# Patient Record
Sex: Female | Born: 2000 | Hispanic: Yes | Marital: Single | Smoking: Never smoker
Health system: Southern US, Community
[De-identification: ages and names within clinical notes are randomized; demographics above are authoritative.]

## PROBLEM LIST (undated history)

## (undated) DIAGNOSIS — Z789 Other specified health status: Secondary | ICD-10-CM

## (undated) DIAGNOSIS — N390 Urinary tract infection, site not specified: Secondary | ICD-10-CM

## (undated) HISTORY — PX: APPENDECTOMY: SHX54

## (undated) HISTORY — DX: Other specified health status: Z78.9

## (undated) HISTORY — DX: Urinary tract infection, site not specified: N39.0

---
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
## 2019-01-05 ENCOUNTER — Ambulatory Visit: Payer: Self-pay | Admitting: Obstetrics & Gynecology

## 2019-01-05 NOTE — Progress Notes (Deleted)
   Patient did not show up today for her scheduled appointment.   Heberto Sturdevant, MD, FACOG Obstetrician & Gynecologist, [facility],  Medical Group  

## 2020-11-17 NOTE — L&D Delivery Note (Addendum)
OB/GYN Faculty Practice Delivery Note  Anita Stokes is a 20 y.o. G2P1001 s/p VD at [redacted]w[redacted]d. She was admitted for eIOL.   ROM: 1h 58m with clear fluid GBS Status: Negative/-- (10/04 1405) Maximum Maternal Temperature: 98.6  Labor Progress: Initial SVE: 1.5/50/-3. S/p cytotec, FB, pitocin, and AROM. She then progressed to complete.   Delivery Date/Time: 08/31/21 @1409  Delivery: Patient was complete and pushing. Head delivered LOA. Loose nuchal cord present x1. Shoulder and body delivered in usual fashion. Infant with spontaneous cry, placed on mother's abdomen, dried and stimulated. Cord clamped x 2 after 1-minute delay, and cut by sister of patient. Cord blood drawn. Placenta delivered spontaneously with gentle cord traction. Fundus firm with massage and Pitocin. Labia, perineum, vagina, and cervix inspected with evidence of a 1st degree perineal laceration. Repaired with 3.0 vicryl suture with good hemostasis.   Baby Weight: pending  Placenta: 3 vessel, intact. Sent to L&D Complications: None Lacerations: 1st degree perineal EBL: 200 mL, TXA ppx Analgesia: Maternally supported and fentanyl intrapartum, fentanyl and lidocaine for laceration repair   Infant:  APGAR (1 MIN): 8   APGAR (5 MINS): 9    Anita Haycraft Autry-Lott, DO 08/31/2021, 2:48 PM PGY-3, [facility]    Midwife attestation: I was gloved and present for delivery in its entirety and I agree with the above resident's note.  09/02/2021, CNM 4:14 PM

## 2021-07-12 ENCOUNTER — Encounter: Payer: Self-pay | Admitting: *Deleted

## 2021-07-24 ENCOUNTER — Other Ambulatory Visit: Payer: Self-pay

## 2021-07-24 ENCOUNTER — Encounter: Payer: Self-pay | Admitting: Nurse Practitioner

## 2021-07-24 ENCOUNTER — Ambulatory Visit (INDEPENDENT_AMBULATORY_CARE_PROVIDER_SITE_OTHER): Payer: Medicaid Other | Admitting: Nurse Practitioner

## 2021-07-24 VITALS — BP 124/74 | HR 103 | Wt 251.0 lb

## 2021-07-24 DIAGNOSIS — Z8679 Personal history of other diseases of the circulatory system: Secondary | ICD-10-CM

## 2021-07-24 DIAGNOSIS — Z3A33 33 weeks gestation of pregnancy: Secondary | ICD-10-CM | POA: Diagnosis not present

## 2021-07-24 DIAGNOSIS — O0990 Supervision of high risk pregnancy, unspecified, unspecified trimester: Secondary | ICD-10-CM

## 2021-07-24 DIAGNOSIS — Z6836 Body mass index (BMI) 36.0-36.9, adult: Secondary | ICD-10-CM

## 2021-07-24 DIAGNOSIS — Z8759 Personal history of other complications of pregnancy, childbirth and the puerperium: Secondary | ICD-10-CM | POA: Insufficient documentation

## 2021-07-24 DIAGNOSIS — Z3490 Encounter for supervision of normal pregnancy, unspecified, unspecified trimester: Secondary | ICD-10-CM | POA: Insufficient documentation

## 2021-07-24 NOTE — Progress Notes (Signed)
Patient complains of daily contractions. She stated that each contraction last 30 seconds and 1 hour apart

## 2021-07-24 NOTE — Progress Notes (Signed)
Subjective:   Anita Stokes is a 20 y.o. G2P1001 at [redacted]w[redacted]d by LMP being seen today for her first obstetrical visit.  Her obstetrical history is significant for  gestational hypertension on medication for last birth, and obesity . Patient  would like to but was not successful breastfeeding last time - did not make much milk.  . Pregnancy history fully reviewed.  Patient reports occasional contractions.  HISTORY: OB History  Gravida Para Term Preterm AB Living  $Remov'2 1 1 'xDtrAM$ 0 0 1  SAB IAB Ectopic Multiple Live Births  0 0 0 0 1    # Outcome Date GA Lbr Len/2nd Weight Sex Delivery Anes PTL Lv  2 Current           1 Term 06/15/16 [redacted]w[redacted]d 01:07 / 00:22 8 lb 12 oz (3.969 kg) M Vag-Spont None  LIV     Name: Darwin,BOY Liberti     Apgar1: 8  Apgar5: 9   Past Medical History:  Diagnosis Date   Medical history non-contributory    Urinary tract infection 11/19/2015   Past Surgical History:  Procedure Laterality Date   APPENDECTOMY     2015   Family History  Problem Relation Age of Onset   Diabetes Mother    Diabetes Father    Social History   Tobacco Use   Smoking status: Never   Smokeless tobacco: Never  Substance Use Topics   Alcohol use: No   Drug use: No   No Known Allergies Current Outpatient Medications on File Prior to Visit  Medication Sig Dispense Refill   Prenatal Vit-Fe Fumarate-FA (MULTIVITAMIN-PRENATAL) 27-0.8 MG TABS tablet Take 1 tablet by mouth every evening.      acetaminophen (TYLENOL) 325 MG tablet Take 325 mg by mouth every 6 (six) hours as needed for mild pain, moderate pain or headache.     No current facility-administered medications on file prior to visit.     Exam   Vitals:   07/24/21 1402  BP: 124/74  Pulse: (!) 103  Weight: 251 lb (113.9 kg)   Fetal Heart Rate (bpm): 153  Uterus:     Pelvic Exam: Perineum: no hemorrhoids, normal perineum   Vulva: normal external genitalia, no lesions   Vagina:  normal mucosa, normal discharge   Cervix:  no lesions and normal, pap smear done.    Adnexa: normal adnexa and no mass, fullness, tenderness   Bony Pelvis: average  System: General: well-developed, well-nourished female in no acute distress   Breast:  normal appearance, no masses or tenderness   Skin: normal coloration and turgor, no rashes   Neurologic: oriented, normal, negative, normal mood   Extremities: normal strength, tone, and muscle mass, ROM of all joints is normal   HEENT extraocular movement intact and sclera clear, anicteric   Mouth/Teeth mucous membranes moist, pharynx normal without lesions and dental hygiene good   Neck supple and no masses, normal thyroid   Cardiovascular: regular rate and rhythm   Respiratory:  no respiratory distress, normal breath sounds   Abdomen: soft, non-tender; no masses,  no organomegaly     Assessment:   Pregnancy: G2P1001 Patient Active Problem List   Diagnosis Date Noted   Supervision of high risk pregnancy, antepartum 07/24/2021   History of chronic hypertension 07/24/2021   Language barrier 05/29/2016     Plan:  1. Supervision of high risk pregnancy, antepartum Ultrasound scheduled Normal one hour glucola in [place] - will not retest but will get Hgb A1C today  In person interpreter present for the entire visit  - Culture, OB Urine - [place] MFM OB DETAIL +14 WK; Future - Hemoglobin A1c - CBC/D/Plt+RPR+Rh+ABO+RubIgG... - Protein / creatinine ratio, urine - Comp Met (CMET)  2. History of chronic hypertension Bp normal today At age 71 had chronic hypertension with induction at [redacted]w[redacted]d with uncomplicated vaginal delivery at [place] reviewed  3. BMI 36.0-36.9,adult   4. [redacted] weeks gestation of pregnancy   Initial labs drawn. Continue prenatal vitamins. Genetic Screening discussed, NIPS: declined. Ultrasound discussed; fetal anatomic survey:  scheduled . Problem list reviewed and updated. The nature of [facility] with  multiple MDs and other Advanced Practice Providers was explained to patient; also emphasized that residents, students are part of our team. Routine obstetric precautions reviewed. Return in about 16 days (around 08/09/2021) for in person for vaginal swabs ROB.  Total face-to-face time with patient: 40 minutes.  Over 50% of encounter was spent on counseling and coordination of care.     Earlie Server, FNP Family Nurse Practitioner, [facility] for [employer], [place] 07/24/2021 9:35 PM

## 2021-07-25 LAB — COMPREHENSIVE METABOLIC PANEL
ALT: 15 IU/L (ref 0–32)
AST: 14 IU/L (ref 0–40)
Albumin/Globulin Ratio: 1.3 (ref 1.2–2.2)
Albumin: 3.9 g/dL (ref 3.9–5.0)
Alkaline Phosphatase: 127 IU/L — ABNORMAL HIGH (ref 44–121)
BUN/Creatinine Ratio: 11 (ref 9–23)
BUN: 6 mg/dL (ref 6–20)
Bilirubin Total: <0.2 mg/dL (ref 0.0–1.2)
CO2: 19 mmol/L — ABNORMAL LOW (ref 20–29)
Calcium: 9.1 mg/dL (ref 8.7–10.2)
Chloride: 102 mmol/L (ref 96–106)
Creatinine, Ser: 0.56 mg/dL — ABNORMAL LOW (ref 0.57–1.00)
Globulin, Total: 3.1 g/dL (ref 1.5–4.5)
Glucose: 69 mg/dL (ref 65–99)
Potassium: 4.1 mmol/L (ref 3.5–5.2)
Sodium: 137 mmol/L (ref 134–144)
Total Protein: 7 g/dL (ref 6.0–8.5)
eGFR: 133 mL/min/{1.73_m2} (ref >59)

## 2021-07-25 LAB — CBC/D/PLT+RPR+RH+ABO+RUBIGG...
Antibody Screen: NEGATIVE
Basophils Absolute: 0 10*3/uL (ref 0.0–0.2)
Basos: 0 %
EOS (ABSOLUTE): 0.1 10*3/uL (ref 0.0–0.4)
Eos: 1 %
HCV Ab: <0.1 s/co ratio (ref 0.0–0.9)
HIV Screen 4th Generation wRfx: NONREACTIVE
Hematocrit: 38.8 % (ref 34.0–46.6)
Hemoglobin: 13.3 g/dL (ref 11.1–15.9)
Hepatitis B Surface Ag: NEGATIVE
Immature Grans (Abs): 0.1 10*3/uL (ref 0.0–0.1)
Immature Granulocytes: 1 %
Lymphocytes Absolute: 2.4 10*3/uL (ref 0.7–3.1)
Lymphs: 18 %
MCH: 29.4 pg (ref 26.6–33.0)
MCHC: 34.3 g/dL (ref 31.5–35.7)
MCV: 86 fL (ref 79–97)
Monocytes Absolute: 0.9 10*3/uL (ref 0.1–0.9)
Monocytes: 7 %
Neutrophils Absolute: 9.7 10*3/uL — ABNORMAL HIGH (ref 1.4–7.0)
Neutrophils: 73 %
Platelets: 223 10*3/uL (ref 150–450)
RBC: 4.52 x10E6/uL (ref 3.77–5.28)
RDW: 12.7 % (ref 11.7–15.4)
RPR Ser Ql: NONREACTIVE
Rh Factor: POSITIVE
Rubella Antibodies, IGG: 6.23 index (ref >0.99)
WBC: 13.1 10*3/uL — ABNORMAL HIGH (ref 3.4–10.8)

## 2021-07-25 LAB — HCV INTERPRETATION

## 2021-07-25 LAB — PROTEIN / CREATININE RATIO, URINE
Creatinine, Urine: 24.9 mg/dL
Protein, Ur: <4 mg/dL

## 2021-07-25 LAB — HEMOGLOBIN A1C
Est. average glucose Bld gHb Est-mCnc: 105 mg/dL
Hgb A1c MFr Bld: 5.3 % (ref 4.8–5.6)

## 2021-07-26 LAB — CULTURE, OB URINE

## 2021-07-26 LAB — URINE CULTURE, OB REFLEX

## 2021-08-06 ENCOUNTER — Ambulatory Visit: Payer: Medicaid Other | Attending: Nurse Practitioner

## 2021-08-06 ENCOUNTER — Ambulatory Visit: Payer: Medicaid Other | Admitting: *Deleted

## 2021-08-06 ENCOUNTER — Encounter: Payer: Self-pay | Admitting: *Deleted

## 2021-08-06 ENCOUNTER — Other Ambulatory Visit: Payer: Self-pay

## 2021-08-06 VITALS — BP 123/63 | HR 105

## 2021-08-06 DIAGNOSIS — O0990 Supervision of high risk pregnancy, unspecified, unspecified trimester: Secondary | ICD-10-CM

## 2021-08-08 ENCOUNTER — Other Ambulatory Visit (HOSPITAL_COMMUNITY)
Admission: RE | Admit: 2021-08-08 | Discharge: 2021-08-08 | Disposition: A | Discharge status: Home / Self Care | Payer: Medicaid Other | Source: Ambulatory Visit | Attending: Obstetrics and Gynecology | Admitting: Obstetrics and Gynecology

## 2021-08-08 ENCOUNTER — Ambulatory Visit (INDEPENDENT_AMBULATORY_CARE_PROVIDER_SITE_OTHER): Payer: Medicaid Other | Admitting: Obstetrics and Gynecology

## 2021-08-08 ENCOUNTER — Other Ambulatory Visit: Payer: Self-pay

## 2021-08-08 DIAGNOSIS — O0990 Supervision of high risk pregnancy, unspecified, unspecified trimester: Secondary | ICD-10-CM

## 2021-08-08 DIAGNOSIS — Z23 Encounter for immunization: Secondary | ICD-10-CM | POA: Diagnosis not present

## 2021-08-08 DIAGNOSIS — Z8759 Personal history of other complications of pregnancy, childbirth and the puerperium: Secondary | ICD-10-CM

## 2021-08-08 LAB — OB RESULTS CONSOLE GC/CHLAMYDIA: Gonorrhea: NEGATIVE

## 2021-08-08 NOTE — Progress Notes (Addendum)
   PRENATAL VISIT NOTE  Subjective:  Anita Stokes is a 20 y.o. G2P1001 at [redacted]w[redacted]d being seen today for ongoing prenatal care.  She is currently monitored for the following issues for this low-risk pregnancy and has Language barrier; Supervision of high risk pregnancy, antepartum; and History of gestational hypertension on their problem list.  Patient reports  vaginal discharge .  Contractions: Irritability. Vag. Bleeding: None.  Movement: Present. Denies leaking of fluid.   The following portions of the patient's history were reviewed and updated as appropriate: allergies, current medications, past family history, past medical history, past social history, past surgical history and problem list.   Objective:   Vitals:   08/08/21 1431  BP: 119/66  Pulse: 98  Weight: 256 lb 11.2 oz (116.4 kg)    Fetal Status: Fetal Heart Rate (bpm): 155 Fundal Height: 37 cm Movement: Present  Presentation: Vertex  General:  Alert, oriented and cooperative. Patient is in no acute distress.  Skin: Skin is warm and dry. No rash noted.   Cardiovascular: Normal heart rate noted  Respiratory: Normal respiratory effort, no problems with respiration noted  Abdomen: Soft, gravid, appropriate for gestational age.  Pain/Pressure: Present     Pelvic: Cervical exam performed in the presence of a chaperone Dilation: 1.5 Effacement (%): 50 Station: -3  Extremities: Normal range of motion.  Edema: Trace  Mental Status: Normal mood and affect. Normal behavior. Normal judgment and thought content.   Assessment and Plan:  Pregnancy: G2P1001 at [redacted]w[redacted]d 1. Supervision of high risk pregnancy, antepartum GBS nv. Talk more about birth control nv. Anatomy u/s 87%, 3107gm, ac 99%, afi 18.2  - Cervicovaginal ancillary only( [place])  2. History of gestational hypertension I talked to her and asked her if she had ever been dx with htn outside of pregnancy and she said no. I asked her if she ever needed to take any  medications out of pregnancy for her BP and she said no. Based on this, I took away the dx of cHTN and just a h/o gHTN in her last pregnancy.   Preterm labor symptoms and general obstetric precautions including but not limited to vaginal bleeding, contractions, leaking of fluid and fetal movement were reviewed in detail with the patient. Please refer to After Visit Summary for other counseling recommendations.   Return in about 1 week (around 08/15/2021) for in person, low risk ob, md or app.  No future appointments.  [place] Bing, MD

## 2021-08-09 LAB — CERVICOVAGINAL ANCILLARY ONLY
Bacterial Vaginitis (gardnerella): NEGATIVE
Candida Glabrata: NEGATIVE
Candida Vaginitis: POSITIVE — AB
Chlamydia: NEGATIVE
Comment: NEGATIVE
Comment: NEGATIVE
Comment: NEGATIVE
Comment: NEGATIVE
Comment: NEGATIVE
Comment: NORMAL
Neisseria Gonorrhea: NEGATIVE
Trichomonas: NEGATIVE

## 2021-08-13 DIAGNOSIS — O0990 Supervision of high risk pregnancy, unspecified, unspecified trimester: Secondary | ICD-10-CM

## 2021-08-15 ENCOUNTER — Telehealth: Payer: Self-pay

## 2021-08-15 NOTE — Telephone Encounter (Signed)
Call placed to pt x 2. No answer and unable to leave message on VM. Called with [employer].  Pt has next ROB appt on 10/4 and will make aware of results at that appt if unable to reach.  Judeth Cornfield, RN

## 2021-08-15 NOTE — Telephone Encounter (Signed)
Pt returned call. Pt's sister on the phone with pt. Pt given results and recommendations pre Dr Vergie Living. Pt advised to pick up Monistat 7 from pharmacy for treatment. Pt verbalized understanding. Judeth Cornfield, RN

## 2021-08-15 NOTE — Telephone Encounter (Signed)
-----   Message from [place] Bing, MD sent at 08/14/2021  8:07 AM EDT ----- Can you let her know that she has a yeast infection and I've sent in monistat 7 for her? thanks

## 2021-08-20 ENCOUNTER — Other Ambulatory Visit: Payer: Self-pay

## 2021-08-20 ENCOUNTER — Ambulatory Visit (INDEPENDENT_AMBULATORY_CARE_PROVIDER_SITE_OTHER): Payer: Medicaid Other | Admitting: Advanced Practice Midwife

## 2021-08-20 VITALS — BP 105/67 | HR 99 | Wt 257.5 lb

## 2021-08-20 DIAGNOSIS — Z8759 Personal history of other complications of pregnancy, childbirth and the puerperium: Secondary | ICD-10-CM | POA: Diagnosis not present

## 2021-08-20 DIAGNOSIS — O3663X0 Maternal care for excessive fetal growth, third trimester, not applicable or unspecified: Secondary | ICD-10-CM

## 2021-08-20 DIAGNOSIS — Z789 Other specified health status: Secondary | ICD-10-CM | POA: Diagnosis not present

## 2021-08-20 DIAGNOSIS — Z3A37 37 weeks gestation of pregnancy: Secondary | ICD-10-CM

## 2021-08-20 DIAGNOSIS — Z3493 Encounter for supervision of normal pregnancy, unspecified, third trimester: Secondary | ICD-10-CM

## 2021-08-20 NOTE — Progress Notes (Signed)
   PRENATAL VISIT NOTE  Subjective:  Anita Stokes is a 20 y.o. G2P1001 at [redacted]w[redacted]d being seen today for ongoing prenatal care.  She is currently monitored for the following issues for this low-risk pregnancy and has Language barrier; Supervision of low-risk pregnancy; and History of gestational hypertension on their problem list.  Patient reports no complaints.  Contractions: Irritability. Vag. Bleeding: None.  Movement: Present. Denies leaking of fluid.   The following portions of the patient's history were reviewed and updated as appropriate: allergies, current medications, past family history, past medical history, past social history, past surgical history and problem list.   Objective:   Vitals:   08/20/21 1325  BP: 105/67  Pulse: 99  Weight: 257 lb 8 oz (116.8 kg)    Fetal Status: Fetal Heart Rate (bpm): 139   Movement: Present  Presentation: Vertex  General:  Alert, oriented and cooperative. Patient is in no acute distress.  Skin: Skin is warm and dry. No rash noted.   Cardiovascular: Normal heart rate noted  Respiratory: Normal respiratory effort, no problems with respiration noted  Abdomen: Soft, gravid, appropriate for gestational age.  Pain/Pressure: Present     Pelvic: Cervical exam performed in the presence of a chaperone Dilation: 1 Effacement (%): 50 Station: -3  Extremities: Normal range of motion.  Edema: None  Mental Status: Normal mood and affect. Normal behavior. Normal judgment and thought content.   Assessment and Plan:  Pregnancy: G2P1001 at [redacted]w[redacted]d 1. Language barrier affecting health care --Spanish interpreter used for all communication  2. [redacted] weeks gestation of pregnancy   3. History of gestational hypertension --BP grossly wnl today, no HTN this pregnancy  4. Encounter for supervision of low-risk pregnancy in third trimester --Anticipatory guidance about next visits/weeks of pregnancy given. --Next visit in 1 week   5. Excessive fetal growth  affecting management of pregnancy in third trimester, single or unspecified fetus --[place] on 08/06/21 with EFW 87%tile, FH today 39 cm at [redacted]w[redacted]d, c/w [place] estimate --Discussed risks to LGA with labor course, at delivery.  Will watch labor progress more closely.     Term labor symptoms and general obstetric precautions including but not limited to vaginal bleeding, contractions, leaking of fluid and fetal movement were reviewed in detail with the patient. Please refer to After Visit Summary for other counseling recommendations.   Return in about 1 week (around 08/27/2021).  Future Appointments  Date Time Provider Department Center  08/28/2021  1:35 PM Irene Bing, MD [facility]    Sharen Counter, CNM

## 2021-08-20 NOTE — Patient Instructions (Signed)
Razones para volver a [facility]:  1. Las contracciones son cada 5 minutos o menos, cada uno de los ltimos 1 minuto, stos han llevado a cabo durante 1-2 horas, y no se puede caminar o hablar durante ellas 2. Usted tiene un gran chorro de lquido o un goteo de lquido que no se detiene y hay que usar una toalla 3. Ha de sangrado que es de color rojo brillante, denso que el manchado - como sangrado menstrual (manchado puede ser normal en trabajo de parto prematuro o despus de una comprobacin de su cuello uterino) 4. Usted no se siente el beb se mueve como l / ella hace normalmente  

## 2021-08-20 NOTE — Progress Notes (Signed)
Patient reports pain in pelvis that started about 3 days ago

## 2021-08-24 LAB — CULTURE, BETA STREP (GROUP B ONLY): Strep Gp B Culture: NEGATIVE

## 2021-08-28 ENCOUNTER — Other Ambulatory Visit: Payer: Self-pay

## 2021-08-28 ENCOUNTER — Encounter: Payer: Self-pay | Admitting: Nurse Practitioner

## 2021-08-28 ENCOUNTER — Other Ambulatory Visit: Payer: Self-pay | Admitting: Advanced Practice Midwife

## 2021-08-28 ENCOUNTER — Ambulatory Visit (INDEPENDENT_AMBULATORY_CARE_PROVIDER_SITE_OTHER): Payer: Medicaid Other | Admitting: Nurse Practitioner

## 2021-08-28 VITALS — BP 132/72 | HR 107 | Wt 261.8 lb

## 2021-08-28 DIAGNOSIS — O3663X0 Maternal care for excessive fetal growth, third trimester, not applicable or unspecified: Secondary | ICD-10-CM | POA: Diagnosis not present

## 2021-08-28 DIAGNOSIS — O0990 Supervision of high risk pregnancy, unspecified, unspecified trimester: Secondary | ICD-10-CM

## 2021-08-28 DIAGNOSIS — O368130 Decreased fetal movements, third trimester, not applicable or unspecified: Secondary | ICD-10-CM | POA: Diagnosis not present

## 2021-08-28 DIAGNOSIS — Z789 Other specified health status: Secondary | ICD-10-CM | POA: Diagnosis not present

## 2021-08-28 DIAGNOSIS — Z3A38 38 weeks gestation of pregnancy: Secondary | ICD-10-CM

## 2021-08-28 NOTE — Progress Notes (Signed)
    Subjective:  Anita Stokes is a 20 y.o. G2P1001 at [redacted]w[redacted]d being seen today for ongoing prenatal care.  She is currently monitored for the following issues for this high-risk pregnancy and has Language barrier; Supervision of low-risk pregnancy; and History of gestational hypertension on their problem list.  Patient reports  decreased fetal movement, pressure over her heart and shortness of breath .  Contractions: Irritability. Vag. Bleeding: None.  Movement: (!) Decreased. Denies leaking of fluid.   The following portions of the patient's history were reviewed and updated as appropriate: allergies, current medications, past family history, past medical history, past social history, past surgical history and problem list. Problem list updated.  Objective:   Vitals:   08/28/21 1359  BP: 132/72  Pulse: (!) 107  Weight: 261 lb 12.8 oz (118.8 kg)    Fetal Status: Fetal Heart Rate (bpm): 163   Movement: (!) Decreased     General:  Alert, oriented and cooperative. Patient is in no acute distress.  Skin: Skin is warm and dry. No rash noted.   Cardiovascular: Normal heart rate noted  Respiratory: Normal respiratory effort, no problems with respiration noted  Abdomen: Soft, gravid, appropriate for gestational age. Pain/Pressure: Present     Pelvic:  Cervical exam deferred        Extremities: Normal range of motion.  Edema: Mild pitting, slight indentation  Mental Status: Normal mood and affect. Normal behavior. Normal judgment and thought content.   Urinalysis:      Assessment and Plan:  Pregnancy: G2P1001 at [redacted]w[redacted]d  1. Supervision of high risk pregnancy, antepartum Takes tums for heartburn History of chronic hypertension in last pregnancy - BP today higher than usual, will repeat labs to check for pre-eclampsia States she has pressure over her heart - has heartburn and BP is ok today so will recheck again on Friday.  Could be a combination of large fetal size and heartburn causing her  symptoms.  No diaphoresis or weakness, but client is worried.  Does have tachycardia but is normal  Consult with Dr. Crissie Reese - will schedule induction for Friday at 39 weeks  2. Excessive fetal growth affecting management of pregnancy in third trimester, single or unspecified fetus At last [place] baby was at 87%  3. Decreased fetal movements in third trimester, single or unspecified fetus Will get NST today  4. Language barrier Interpreter present for visit  5. [redacted] weeks gestation of pregnancy   Term labor symptoms and general obstetric precautions including but not limited to vaginal bleeding, contractions, leaking of fluid and fetal movement were reviewed in detail with the patient. Please refer to After Visit Summary for other counseling recommendations.  Return in about 2 days (around 08/30/2021) for in person ROB.  Nolene Bernheim, RN, MSN, NP-BC Nurse Practitioner, [facility] for [employer], [facility] 08/28/2021 2:26 PM

## 2021-08-29 LAB — COMPREHENSIVE METABOLIC PANEL
ALT: 13 IU/L (ref 0–32)
AST: 14 IU/L (ref 0–40)
Albumin/Globulin Ratio: 1.1 — ABNORMAL LOW (ref 1.2–2.2)
Albumin: 3.5 g/dL — ABNORMAL LOW (ref 3.9–5.0)
Alkaline Phosphatase: 163 IU/L — ABNORMAL HIGH (ref 44–121)
BUN/Creatinine Ratio: 11 (ref 9–23)
BUN: 7 mg/dL (ref 6–20)
Bilirubin Total: 0.2 mg/dL (ref 0.0–1.2)
CO2: 17 mmol/L — ABNORMAL LOW (ref 20–29)
Calcium: 8.9 mg/dL (ref 8.7–10.2)
Chloride: 104 mmol/L (ref 96–106)
Creatinine, Ser: 0.61 mg/dL (ref 0.57–1.00)
Globulin, Total: 3.2 g/dL (ref 1.5–4.5)
Glucose: 89 mg/dL (ref 70–99)
Potassium: 3.9 mmol/L (ref 3.5–5.2)
Sodium: 136 mmol/L (ref 134–144)
Total Protein: 6.7 g/dL (ref 6.0–8.5)
eGFR: 130 mL/min/{1.73_m2} (ref >59)

## 2021-08-29 LAB — CBC
Hematocrit: 37.6 % (ref 34.0–46.6)
Hemoglobin: 12.9 g/dL (ref 11.1–15.9)
MCH: 29.1 pg (ref 26.6–33.0)
MCHC: 34.3 g/dL (ref 31.5–35.7)
MCV: 85 fL (ref 79–97)
Platelets: 238 10*3/uL (ref 150–450)
RBC: 4.44 x10E6/uL (ref 3.77–5.28)
RDW: 12.6 % (ref 11.7–15.4)
WBC: 13.3 10*3/uL — ABNORMAL HIGH (ref 3.4–10.8)

## 2021-08-29 LAB — PROTEIN / CREATININE RATIO, URINE
Creatinine, Urine: 102.3 mg/dL
Protein, Ur: 10.7 mg/dL
Protein/Creat Ratio: 105 mg/g creat (ref 0–200)

## 2021-08-30 ENCOUNTER — Inpatient Hospital Stay (HOSPITAL_COMMUNITY)
Admission: AD | Admit: 2021-08-30 | Discharge: 2021-09-01 | DRG: 807 | Disposition: A | Discharge status: Home / Self Care | Payer: Medicaid Other | Attending: Family Medicine | Admitting: Family Medicine

## 2021-08-30 ENCOUNTER — Other Ambulatory Visit: Payer: Self-pay

## 2021-08-30 ENCOUNTER — Encounter (HOSPITAL_COMMUNITY): Payer: Self-pay | Admitting: Family Medicine

## 2021-08-30 ENCOUNTER — Inpatient Hospital Stay (HOSPITAL_COMMUNITY): Payer: Medicaid Other

## 2021-08-30 DIAGNOSIS — Z3493 Encounter for supervision of normal pregnancy, unspecified, third trimester: Secondary | ICD-10-CM

## 2021-08-30 DIAGNOSIS — Z8759 Personal history of other complications of pregnancy, childbirth and the puerperium: Secondary | ICD-10-CM

## 2021-08-30 DIAGNOSIS — Z20822 Contact with and (suspected) exposure to covid-19: Secondary | ICD-10-CM | POA: Diagnosis present

## 2021-08-30 DIAGNOSIS — Z3A39 39 weeks gestation of pregnancy: Secondary | ICD-10-CM | POA: Diagnosis not present

## 2021-08-30 DIAGNOSIS — O26893 Other specified pregnancy related conditions, third trimester: Secondary | ICD-10-CM | POA: Diagnosis present

## 2021-08-30 DIAGNOSIS — Z23 Encounter for immunization: Secondary | ICD-10-CM | POA: Diagnosis not present

## 2021-08-30 DIAGNOSIS — Z3490 Encounter for supervision of normal pregnancy, unspecified, unspecified trimester: Secondary | ICD-10-CM | POA: Diagnosis present

## 2021-08-30 LAB — CBC
HCT: 36.9 % (ref 36.0–46.0)
Hemoglobin: 12.4 g/dL (ref 12.0–15.0)
MCH: 29.2 pg (ref 26.0–34.0)
MCHC: 33.6 g/dL (ref 30.0–36.0)
MCV: 86.8 fL (ref 80.0–100.0)
Platelets: 222 10*3/uL (ref 150–400)
RBC: 4.25 MIL/uL (ref 3.87–5.11)
RDW: 13.3 % (ref 11.5–15.5)
WBC: 13.4 10*3/uL — ABNORMAL HIGH (ref 4.0–10.5)
nRBC: 0 % (ref 0.0–0.2)

## 2021-08-30 MED ORDER — LIDOCAINE HCL (PF) 1 % IJ SOLN
30.0000 mL | INTRAMUSCULAR | Status: AC | PRN
  Administered 2021-08-31: 30 mL via SUBCUTANEOUS
  Filled 2021-08-30: qty 30

## 2021-08-30 MED ORDER — OXYCODONE-ACETAMINOPHEN 5-325 MG PO TABS: 2.0000 | ORAL_TABLET | ORAL | Status: DC | PRN

## 2021-08-30 MED ORDER — LACTATED RINGERS IV SOLN: 500.0000 mL | INTRAVENOUS | Status: DC | PRN

## 2021-08-30 MED ORDER — OXYTOCIN-SODIUM CHLORIDE 30-0.9 UT/500ML-% IV SOLN
1.0000 m[IU]/min | INTRAVENOUS | Status: DC
Start: 2021-08-30 — End: 2021-08-31
  Filled 2021-08-30: qty 500

## 2021-08-30 MED ORDER — ACETAMINOPHEN 325 MG PO TABS: 650.0000 mg | ORAL_TABLET | ORAL | Status: DC | PRN

## 2021-08-30 MED ORDER — FLEET ENEMA 7-19 GM/118ML RE ENEM: 1.0000 | ENEMA | RECTAL | Status: DC | PRN

## 2021-08-30 MED ORDER — TERBUTALINE SULFATE 1 MG/ML IJ SOLN: 0.2500 mg | Freq: Once | INTRAMUSCULAR | Status: DC | PRN

## 2021-08-30 MED ORDER — ONDANSETRON HCL 4 MG/2ML IJ SOLN: 4.0000 mg | Freq: Four times a day (QID) | INTRAMUSCULAR | Status: DC | PRN

## 2021-08-30 MED ORDER — MISOPROSTOL 25 MCG QUARTER TABLET: 25.0000 ug | ORAL_TABLET | ORAL | Status: DC | PRN

## 2021-08-30 MED ORDER — OXYTOCIN BOLUS FROM INFUSION
333.0000 mL | Freq: Once | INTRAVENOUS | Status: AC
Start: 2021-08-30 — End: 2021-08-31
  Administered 2021-08-31: 333 mL via INTRAVENOUS

## 2021-08-30 MED ORDER — LACTATED RINGERS IV SOLN: INTRAVENOUS | Status: DC

## 2021-08-30 MED ORDER — SOD CITRATE-CITRIC ACID 500-334 MG/5ML PO SOLN: 30.0000 mL | ORAL | Status: DC | PRN

## 2021-08-30 MED ORDER — OXYTOCIN-SODIUM CHLORIDE 30-0.9 UT/500ML-% IV SOLN
2.5000 [IU]/h | INTRAVENOUS | Status: DC
  Administered 2021-08-31: 2.5 [IU]/h via INTRAVENOUS

## 2021-08-30 MED ORDER — OXYCODONE-ACETAMINOPHEN 5-325 MG PO TABS: 1.0000 | ORAL_TABLET | ORAL | Status: DC | PRN

## 2021-08-30 NOTE — H&P (Addendum)
OBSTETRIC ADMISSION HISTORY AND PHYSICAL  Anita Stokes is a 20 y.o. female G2P1001 with IUP at [redacted]w[redacted]d by LMP presenting for elective IOL. She reports +FMs, No LOF, no VB, no blurry vision, headaches or peripheral edema, and RUQ pain.  She plans on breast and bottle feeding. She declines birth control. She received her prenatal [facility]   Dating: By LMP --->  Estimated Date of Delivery: 09/06/21  Sono (08/06/21):  @[redacted]w[redacted]d , normal anatomy, cephalic presentation, 3107g, EFW  Spanish interpreter on iPad throughout entire encounter.   Prenatal History/Complications: language barrier (Spanish), cHTN in previous pregnancy, excessive fetal growth  Past Medical History: Past Medical History:  Diagnosis Date   Medical history non-contributory    Urinary tract infection 11/19/2015    Past Surgical History: Past Surgical History:  Procedure Laterality Date   APPENDECTOMY     2015    Obstetrical History: OB History     Gravida  2   Para  1   Term  1   Preterm      AB      Living  1      SAB      IAB      Ectopic      Multiple  0   Live Births  1           Social History Social History   Socioeconomic History   Marital status: Single    Spouse name: Not on file   Number of children: Not on file   Years of education: Not on file   Highest education level: Not on file  Occupational History   Not on file  Tobacco Use   Smoking status: Never   Smokeless tobacco: Never  Substance and Sexual Activity   Alcohol use: No   Drug use: No   Sexual activity: Yes  Other Topics Concern   Not on file  Social History Narrative   Not on file   Social Determinants of Health   Financial Resource Strain: Not on file  Food Insecurity: No Food Insecurity   Worried About Running Out of Food in the Last Year: Never true   Ran Out of Food in the Last Year: Never true  Transportation Needs: No Transportation Needs   Lack of Transportation (Medical): No   Lack of  Transportation (Non-Medical): No  Physical Activity: Not on file  Stress: Not on file  Social Connections: Not on file    Family History: Family History  Problem Relation Age of Onset   Diabetes Mother    Diabetes Father     Allergies: No Known Allergies  Medications Prior to Admission  Medication Sig Dispense Refill Last Dose   acetaminophen (TYLENOL) 325 MG tablet Take 325 mg by mouth every 6 (six) hours as needed for mild pain, moderate pain or headache.      Prenatal Vit-Fe Fumarate-FA (MULTIVITAMIN-PRENATAL) 27-0.8 MG TABS tablet Take 1 tablet by mouth every evening.         Review of Systems   All systems reviewed and negative except as stated in HPI  Last menstrual period 11/30/2020, unknown if currently breastfeeding. General appearance: alert and cooperative Lungs: Normal WOB  Heart: regular rate and rhythm Abdomen: soft, non-tender; bowel sounds normal Pelvic: 1.5/50/-3 Extremities: Homans sign is negative, no sign of DVT Presentation: cephalic Fetal monitoringBaseline: 140 bpm, Variability: Good {> 6 bpm), Accelerations: Reactive, and Decelerations: Absent Uterine activity: None   Prenatal labs: ABO, Rh: O/Positive/-- (09/07 1516) Antibody: Negative (  09/07 1516) Rubella: 6.23 (09/07 1516) RPR: Non Reactive (09/07 1516)  HBsAg: Negative (09/07 1516)  HIV: Non Reactive (09/07 1516)  GBS: Negative/-- (10/04 1405)  1 hr Glucola: none A1c (07/24/21): 5.3 Genetic screening: none Anatomy US: normal  Prenatal Transfer Tool  Maternal Diabetes: No Genetic Screening: Declined Maternal Ultrasounds/Referrals: Normal Fetal Ultrasounds or other Referrals:  None Maternal Substance Abuse:  No Significant Maternal Medications:  None Significant Maternal Lab Results: None  No results found for this or any previous visit (from the past 24 hour(s)).  Patient Active Problem List   Diagnosis Date Noted   Supervision of low-risk pregnancy 07/24/2021   History of  gestational hypertension 07/24/2021   Language barrier 05/29/2016    Assessment/Plan:  Anita Stokes is a 20 y.o. G2P1001 at [redacted]w[redacted]d here for elective IOL.   #Labor: After verbal consent, attempted to place FB manually, however unsuccessful due to significantly posterior. Speculum placement attempted as well without success. Cytotec buccal given. Consider re-trying FB on next check if still appropriate.  #Pain: None #Fetal Well Being: Category I #ID: GBS neg #Method Of Feeding: both #Method Of Contraception: no method, aware of her options.  #Circ: N/A  #Size greater than dates: Noted during prenatal care. EFW 87%tile at [redacted]w[redacted]d (3107gm). Pelvis proven to 3969g.   Shelby Mattocks, DO 08/30/2021, 10:37 PM PGY-1, [facility]  GME ATTESTATION:  I saw and evaluated the patient. I agree with the findings and the plan of care as documented in the resident's note.  Leticia Penna, DO OB Fellow, Faculty [facility], Center for [facility] 08/31/2021 2:12 AM

## 2021-08-31 ENCOUNTER — Encounter (HOSPITAL_COMMUNITY): Payer: Self-pay | Admitting: Family Medicine

## 2021-08-31 DIAGNOSIS — Z3A39 39 weeks gestation of pregnancy: Secondary | ICD-10-CM

## 2021-08-31 LAB — TYPE AND SCREEN
ABO/RH(D): O POS
Antibody Screen: NEGATIVE

## 2021-08-31 LAB — RESP PANEL BY RT-PCR (FLU A&B, COVID) ARPGX2
Influenza A by PCR: NEGATIVE
Influenza B by PCR: NEGATIVE
SARS Coronavirus 2 by RT PCR: NEGATIVE

## 2021-08-31 LAB — RPR: RPR Ser Ql: NONREACTIVE

## 2021-08-31 MED ORDER — INFLUENZA VAC SPLIT QUAD 0.5 ML IM SUSY
0.5000 mL | PREFILLED_SYRINGE | INTRAMUSCULAR | Status: AC
  Administered 2021-09-01: 0.5 mL via INTRAMUSCULAR
  Filled 2021-08-31: qty 0.5

## 2021-08-31 MED ORDER — MISOPROSTOL 50MCG HALF TABLET
ORAL_TABLET | ORAL | Status: AC
  Filled 2021-08-31: qty 1

## 2021-08-31 MED ORDER — FENTANYL CITRATE (PF) 100 MCG/2ML IJ SOLN
50.0000 ug | INTRAMUSCULAR | Status: DC | PRN
Start: 2021-08-31 — End: 2021-08-31
  Administered 2021-08-31: 100 ug via INTRAVENOUS
  Administered 2021-08-31: 50 ug via INTRAVENOUS
  Filled 2021-08-31 (×2): qty 2

## 2021-08-31 MED ORDER — COCONUT OIL OIL: 1.0000 "application " | TOPICAL_OIL | Status: DC | PRN

## 2021-08-31 MED ORDER — WITCH HAZEL-GLYCERIN EX PADS: 1.0000 "application " | MEDICATED_PAD | CUTANEOUS | Status: DC | PRN

## 2021-08-31 MED ORDER — SIMETHICONE 80 MG PO CHEW: 80.0000 mg | CHEWABLE_TABLET | ORAL | Status: DC | PRN

## 2021-08-31 MED ORDER — IBUPROFEN 600 MG PO TABS
600.0000 mg | ORAL_TABLET | Freq: Four times a day (QID) | ORAL | Status: DC
  Administered 2021-08-31 – 2021-09-01 (×5): 600 mg via ORAL
  Filled 2021-08-31 (×4): qty 1

## 2021-08-31 MED ORDER — MISOPROSTOL 50MCG HALF TABLET
50.0000 ug | ORAL_TABLET | ORAL | Status: DC | PRN
  Administered 2021-08-31: 50 ug via BUCCAL

## 2021-08-31 MED ORDER — OXYTOCIN-SODIUM CHLORIDE 30-0.9 UT/500ML-% IV SOLN: 1.0000 m[IU]/min | INTRAVENOUS | Status: DC

## 2021-08-31 MED ORDER — BENZOCAINE-MENTHOL 20-0.5 % EX AERO: 1.0000 "application " | INHALATION_SPRAY | CUTANEOUS | Status: DC | PRN

## 2021-08-31 MED ORDER — ONDANSETRON HCL 4 MG PO TABS: 4.0000 mg | ORAL_TABLET | ORAL | Status: DC | PRN

## 2021-08-31 MED ORDER — SENNOSIDES-DOCUSATE SODIUM 8.6-50 MG PO TABS
2.0000 | ORAL_TABLET | Freq: Every day | ORAL | Status: DC
  Administered 2021-09-01: 2 via ORAL
  Filled 2021-08-31: qty 2

## 2021-08-31 MED ORDER — ONDANSETRON HCL 4 MG/2ML IJ SOLN: 4.0000 mg | INTRAMUSCULAR | Status: DC | PRN

## 2021-08-31 MED ORDER — TRANEXAMIC ACID-NACL 1000-0.7 MG/100ML-% IV SOLN: 1000.0000 mg | INTRAVENOUS | Status: DC

## 2021-08-31 MED ORDER — DIPHENHYDRAMINE HCL 25 MG PO CAPS: 25.0000 mg | ORAL_CAPSULE | Freq: Four times a day (QID) | ORAL | Status: DC | PRN

## 2021-08-31 MED ORDER — ZOLPIDEM TARTRATE 5 MG PO TABS: 5.0000 mg | ORAL_TABLET | Freq: Every evening | ORAL | Status: DC | PRN

## 2021-08-31 MED ORDER — FENTANYL CITRATE (PF) 100 MCG/2ML IJ SOLN
INTRAMUSCULAR | Status: AC
  Administered 2021-08-31: 100 ug via INTRAVENOUS
  Filled 2021-08-31: qty 2

## 2021-08-31 MED ORDER — TRANEXAMIC ACID-NACL 1000-0.7 MG/100ML-% IV SOLN
INTRAVENOUS | Status: AC
  Administered 2021-08-31: 1000 mg
  Filled 2021-08-31: qty 100

## 2021-08-31 MED ORDER — TETANUS-DIPHTH-ACELL PERTUSSIS 5-2.5-18.5 LF-MCG/0.5 IM SUSY: 0.5000 mL | PREFILLED_SYRINGE | Freq: Once | INTRAMUSCULAR | Status: DC

## 2021-08-31 MED ORDER — OXYTOCIN-SODIUM CHLORIDE 30-0.9 UT/500ML-% IV SOLN
1.0000 m[IU]/min | INTRAVENOUS | Status: DC
  Administered 2021-08-31: 1 m[IU]/min via INTRAVENOUS

## 2021-08-31 MED ORDER — ACETAMINOPHEN 325 MG PO TABS
650.0000 mg | ORAL_TABLET | ORAL | Status: DC | PRN
  Administered 2021-08-31: 650 mg via ORAL
  Filled 2021-08-31: qty 2

## 2021-08-31 MED ORDER — PRENATAL MULTIVITAMIN CH
1.0000 | ORAL_TABLET | Freq: Every day | ORAL | Status: DC
  Administered 2021-09-01: 1 via ORAL
  Filled 2021-08-31: qty 1

## 2021-08-31 MED ORDER — DIBUCAINE (PERIANAL) 1 % EX OINT: 1.0000 "application " | TOPICAL_OINTMENT | CUTANEOUS | Status: DC | PRN

## 2021-08-31 NOTE — Progress Notes (Signed)
Labor Progress Note Anita Stokes is a 20 y.o. G2P1001 at [redacted]w[redacted]d presented for eIOL.   S: Doing well, feeling some cramping and contractions occasionally.   O:  BP 133/69   Pulse 84   Temp 98.3 F (36.8 C) (Oral)   Resp 15   Ht 5\' 6"  (1.676 m)   Wt 119 kg   LMP 11/30/2020 (Exact Date)   BMI 42.34 kg/m  EFM: 145/mod/15x15/none Ctx every 2-4 min   CVE: Dilation: 2 Effacement (%): 60-70 Station: -3 Presentation: Vertex Exam by:: Dr. [email or web address]   A&P: 20 y.o. G2P1001 [redacted]w[redacted]d  #Labor: Some progression since last check, relatively soft cervix at this point. After verbal consent, placed FB with some difficulty to left positioned cervix. Patient tolerated well. Will also start pit 1x1 up to approximately 6 while FB still in place.  #Pain: PRN.  #FWB: Cat 1  #GBS negative   [redacted]w[redacted]d, DO 4:16 AM

## 2021-08-31 NOTE — Discharge Summary (Signed)
Postpartum Discharge Summary     Patient Name: Anita Stokes DOB: [birth year] MRN: [id number]  Date of admission: 08/30/2021 Delivery date:08/31/2021  Delivering provider: Gerlene Fee  Date of discharge: 09/01/2021  Admitting diagnosis: Encounter for elective induction of labor [Z34.90] Intrauterine pregnancy: [redacted]w[redacted]d    Secondary diagnosis:  Active Problems:   Encounter for elective induction of labor   Vaginal delivery  Additional problems: None    Discharge diagnosis: Term Pregnancy Delivered                                              Post partum procedures: N/A Augmentation: AROM, Pitocin, Cytotec, and IP Foley Complications: None  Hospital course: Induction of Labor With Vaginal Delivery   20y.o. yo [id number] 3100w1das admitted to the hospital 08/30/2021 for induction of labor.  Indication for induction: Elective.  Patient had an uncomplicated labor course as follows: Membrane Rupture Time/Date: 12:14 PM ,08/31/2021   Delivery Method:Vaginal, Spontaneous  Episiotomy: None  Lacerations:  1st degree;Perineal  Details of delivery can be found in separate delivery note.  Patient had a routine postpartum course. Patient is discharged home 09/01/21.  Newborn Data: Birth date:08/31/2021  Birth time:2:09 PM  Gender:Female  Living status:Living  Apgars:8 ,9  Weight:3640 g   Magnesium Sulfate received: No BMZ received: No Rhophylac:No MMR:No T-DaP:Given prenatally Flu: No Transfusion:No  Physical exam  Vitals:   08/31/21 1758 08/31/21 2145 09/01/21 0530 09/01/21 1318  BP: 124/61 127/64 123/68 135/66  Pulse: 96 87 75 84  Resp: _0 Temp: 99.1 F (37.3 C) 98.6 F (37 C) 98.4 F (36.9 C) 98.1 F (36.7 C)  TempSrc: Oral Oral Oral Oral  SpO2:   98% 97%  Weight:      Height:       General: alert, cooperative, and no distress Lochia: appropriate Uterine Fundus: firm Incision: N/A DVT Evaluation: No evidence of DVT seen on physical  exam. Labs: Lab Results  Component Value Date   WBC 15.6 (H) 09/01/2021   HGB 10.8 (L) 09/01/2021   HCT 32.6 (L) 09/01/2021   MCV 88.3 09/01/2021   PLT 202 09/01/2021   CMP Latest Ref Rng & Units 08/28/2021  Glucose 70 - 99 mg/dL 89  BUN 6 - 20 mg/dL 7  Creatinine 0.57 - 1.00 mg/dL 0.61  Sodium 134 - 144 mmol/L 136  Potassium 3.5 - 5.2 mmol/L 3.9  Chloride 96 - 106 mmol/L 104  CO2 20 - 29 mmol/L 17(L)  Calcium 8.7 - 10.2 mg/dL 8.9  Total Protein 6.0 - 8.5 g/dL 6.7  Total Bilirubin 0.0 - 1.2 mg/dL 0.2  Alkaline Phos 44 - 121 IU/L 163(H)  AST 0 - 40 IU/L 14  ALT 0 - 32 IU/L 13   Edinburgh Score:   After visit meds:  Allergies as of 09/01/2021   No Known Allergies      Medication List     TAKE these medications    acetaminophen 325 MG tablet Commonly known as: TYLENOL Take 325 mg by mouth every 6 (six) hours as needed for mild pain, moderate pain or headache.   ibuprofen 600 MG tablet Commonly known as: ADVIL Take 1 tablet (600 mg total) by mouth every 6 (six) hours.   multivitamin-prenatal 27-0.8 MG Tabs tablet Take 1 tablet by mouth every evening.  Discharge home in stable condition Infant Feeding: Breast Infant Disposition:home with mother Discharge instruction: per After Visit Summary and Postpartum booklet. Activity: Advance as tolerated. Pelvic rest for 6 weeks.  Diet: routine diet Future Appointments: Future Appointments  Date Time Provider [place]  09/09/2021 10:20 AM [facility] NURSE [facility]  10/02/2021  1:15 PM Gabriel Carina, CNM [facility]   Follow up Visit:  Follow-up Information     [facility] Follow up.   Specialty: Obstetrics and Gynecology Why: As needed in emergencies Contact information: [address] [id number] [place] [phone]                Message sent to [facility] on 08/31/21:  Please schedule this patient for a In person  postpartum visit in 4 weeks with the following provider: Any provider. Additional Postpartum F/U:BP check 1 week  Low risk pregnancy complicated by:  labile BPs, isolated elevated BP Delivery mode:  Vaginal, Spontaneous  Anticipated Birth Control:   none   09/01/2021 Manya Silvas, CNM

## 2021-08-31 NOTE — Progress Notes (Signed)
FB fell out per RN. Rechecked cervix, still quite posterior but now about 5/70/-3 and stretchy. Will transition to pit 2x2, anticipate AROM on next check hopefully as cervix comes forward.  Allayne Stack, DO

## 2021-08-31 NOTE — Progress Notes (Signed)
Labor Progress Note Anita Stokes is a 20 y.o. G2P1001 at [redacted]w[redacted]d presented for eIOL.  S: Resting. No concerns at this time. Some discomfort with contractions.   O:  BP 131/64   Pulse 95   Temp 97.9 F (36.6 C) (Oral)   Resp 16   Ht 5\' 6"  (1.676 m)   Wt 119 kg   LMP 11/30/2020 (Exact Date)   BMI 42.34 kg/m  EFM: 130 bpm/15x15/no decels; every 2-3 mins  CVE: Dilation: 5 Effacement (%): 70 Cervical Position: Posterior Station: -3 Presentation: Vertex Exam by:: [email or web address] MD  A&P: 20 y.o. G2P1001 [redacted]w[redacted]d here for eIOL.  #Labor: Cervical exam unchanged. AROM scant bloody show. Patient stated she has been feeling trickling since this morning. Likely increased amount of fluid behind head of infant. Will continue to titrate up on pitocin and monitor for progression of labor.  #Pain: Maternally support #FWB: Cat I #GBS negative  Talene Glastetter Autry-Lott, DO 12:29 PM

## 2021-09-01 LAB — CBC
HCT: 32.6 % — ABNORMAL LOW (ref 36.0–46.0)
Hemoglobin: 10.8 g/dL — ABNORMAL LOW (ref 12.0–15.0)
MCH: 29.3 pg (ref 26.0–34.0)
MCHC: 33.1 g/dL (ref 30.0–36.0)
MCV: 88.3 fL (ref 80.0–100.0)
Platelets: 202 10*3/uL (ref 150–400)
RBC: 3.69 MIL/uL — ABNORMAL LOW (ref 3.87–5.11)
RDW: 13.5 % (ref 11.5–15.5)
WBC: 15.6 10*3/uL — ABNORMAL HIGH (ref 4.0–10.5)
nRBC: 0 % (ref 0.0–0.2)

## 2021-09-01 MED ORDER — IBUPROFEN 600 MG PO TABS: 600.0000 mg | ORAL_TABLET | Freq: Four times a day (QID) | ORAL | 0 refills | Status: DC

## 2021-09-01 MED ORDER — IBUPROFEN 600 MG PO TABS: 600.0000 mg | ORAL_TABLET | Freq: Four times a day (QID) | ORAL | 1 refills | Status: AC

## 2021-09-09 ENCOUNTER — Other Ambulatory Visit: Payer: Self-pay

## 2021-09-09 ENCOUNTER — Ambulatory Visit (INDEPENDENT_AMBULATORY_CARE_PROVIDER_SITE_OTHER): Payer: Medicaid Other

## 2021-09-09 VITALS — BP 116/65 | HR 88 | Wt 243.4 lb

## 2021-09-09 DIAGNOSIS — Z0130 Encounter for examination of blood pressure without abnormal findings: Secondary | ICD-10-CM

## 2021-09-09 NOTE — Progress Notes (Signed)
Blood Pressure Check Visit  Anita Stokes is here for blood pressure check following spontaneous vaginal delivery on 08/31/21. BP today is 116/65. History significant for gestational hypertension in prior pregnancy. Pt denies any elevated BP during this pregnancy or delivery. Patient denies any s/s of elevated BP. Reviewed with Donavan Foil, MD who states pt may follow up at [facility] visit on 10/02/21.  Patient denies any other concerns today. Interpreter Raquel present for encounter.  Marjo Bicker, RN 09/09/2021  11:09 AM

## 2021-09-09 NOTE — Progress Notes (Signed)
Patient was assessed and managed by nursing staff during this encounter. I have reviewed the chart and agree with the documentation and plan. I have also made any necessary editorial changes.  Tonica Brasington A Jenney Brester, MD 09/09/2021 5:27 PM   

## 2021-09-12 ENCOUNTER — Telehealth (HOSPITAL_COMMUNITY): Payer: Self-pay | Admitting: *Deleted

## 2021-09-12 NOTE — Telephone Encounter (Signed)
Hospital discharge follow-up call completed with [facility] [address], [place] #[id number]. Patient voiced no questions or concerns regarding her own health. EPDS not done today - patient scored a 0  on EPDS on 09/09/21.Patient voiced no questions or concerns regarding baby at this time. Patient reported infant sleeps in a crib on her back. RN reviewed ABCs of safe sleep - patient verbalized understanding. Deforest Hoyles, RN, 09/12/21, [phone].

## 2021-10-02 ENCOUNTER — Ambulatory Visit: Payer: Self-pay | Admitting: Certified Nurse Midwife

## 2021-10-21 ENCOUNTER — Ambulatory Visit: Payer: Medicaid Other | Admitting: Obstetrics & Gynecology

## 2021-10-24 ENCOUNTER — Ambulatory Visit: Payer: Medicaid Other | Admitting: Obstetrics & Gynecology

## 2022-04-05 IMAGING — US US MFM OB DETAIL+14 WK
1 series · 13 of 28 positions shown · non-contrast
Comparison: none

[Series 1: us mfm ob detail+14 wk · 58 acquisitions, 13 frames shown]
[im 3/58]
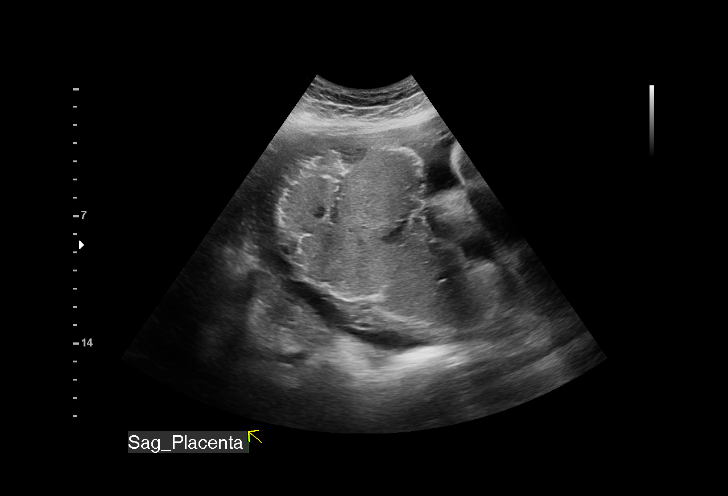
[im 7/58]
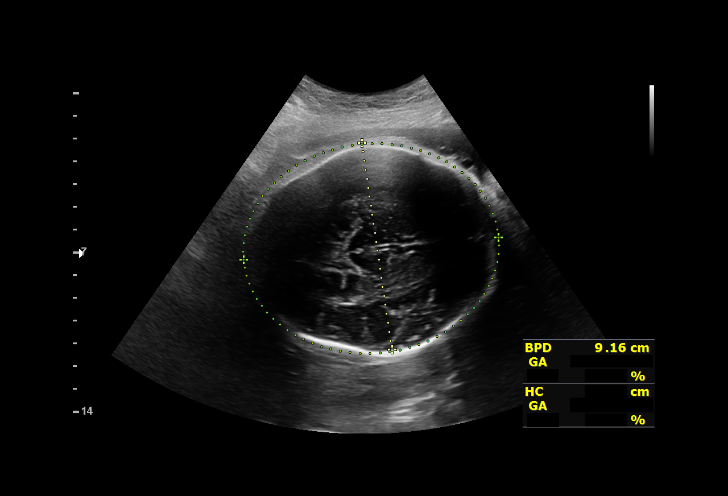
[im 11/58]
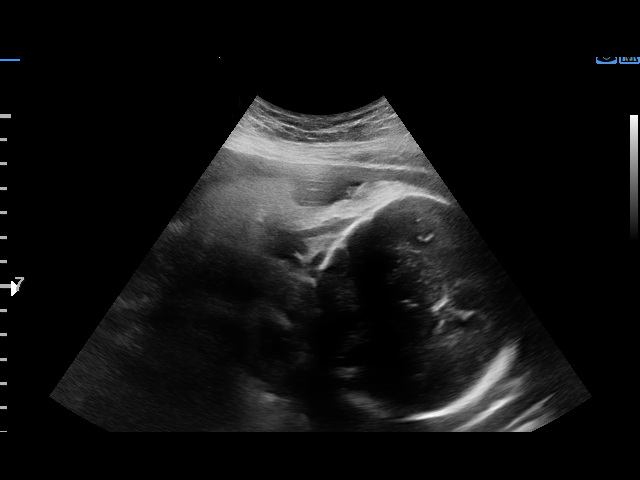
[im 15/58]
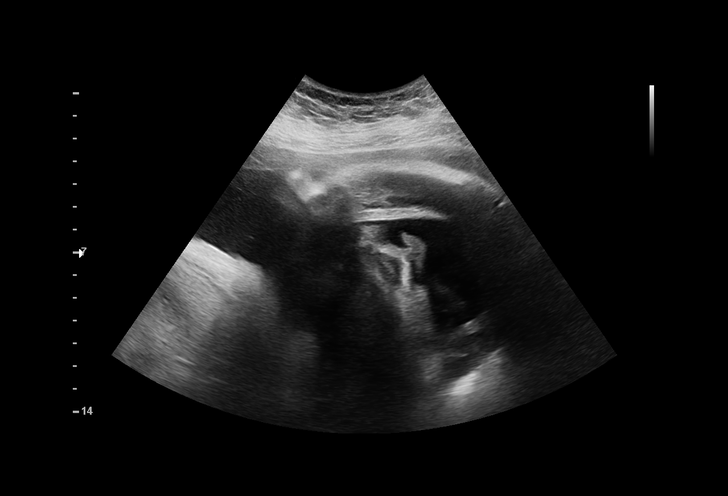
[im 20/58]
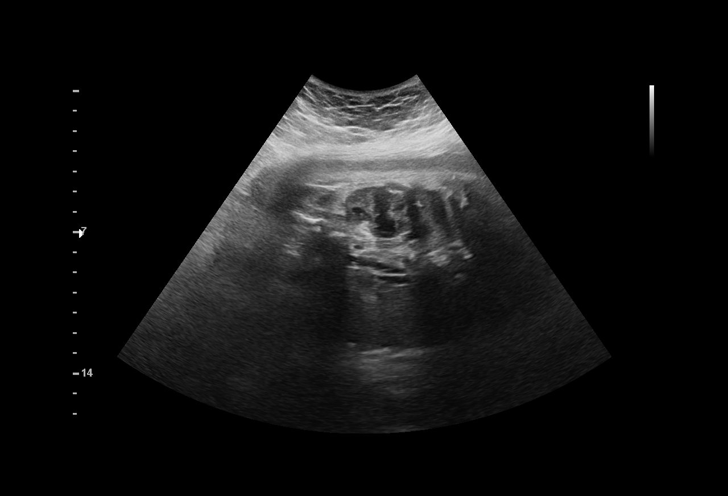
[im 24/58]
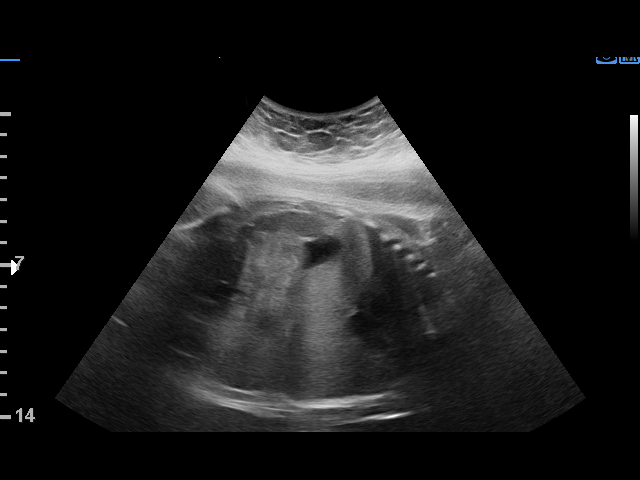
[im 30/58]
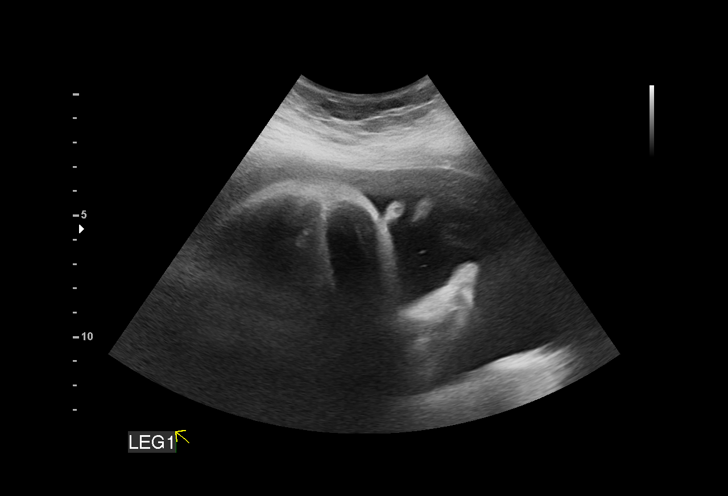
[im 34/58]
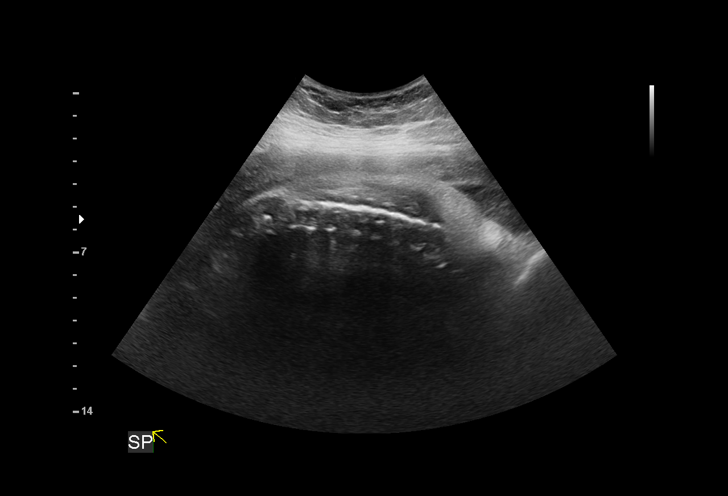
[im 39/58]
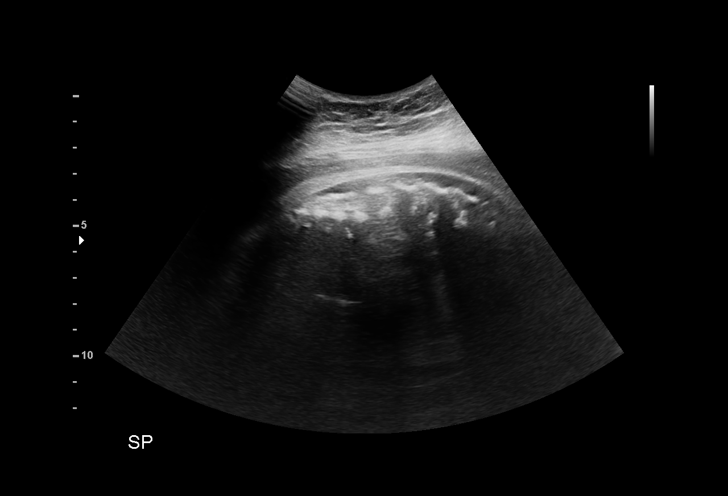
[im 43/58]
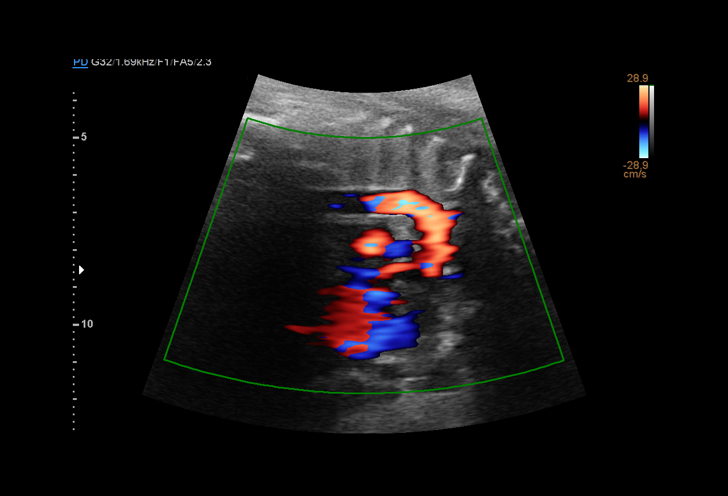
[im 47/58]
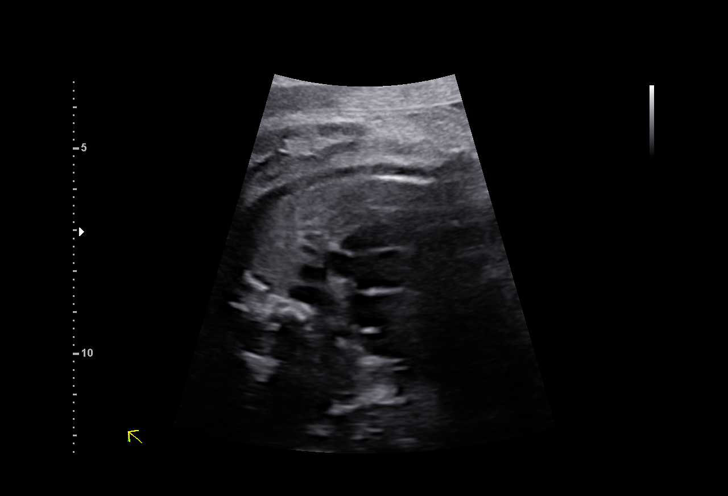
[im 51/58]
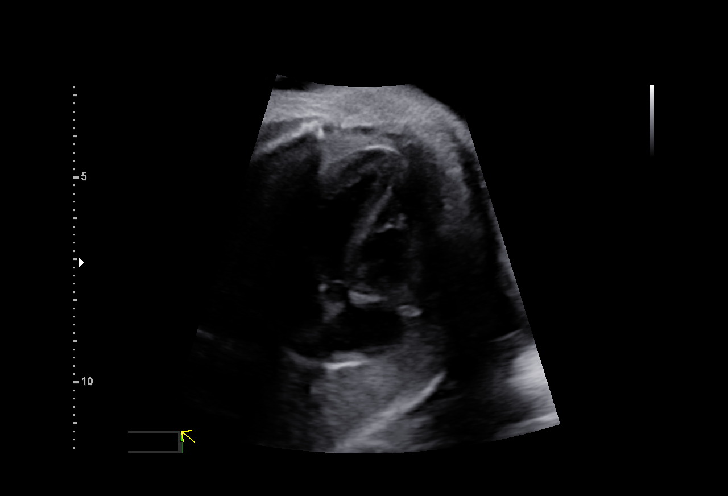
[im 55/58]
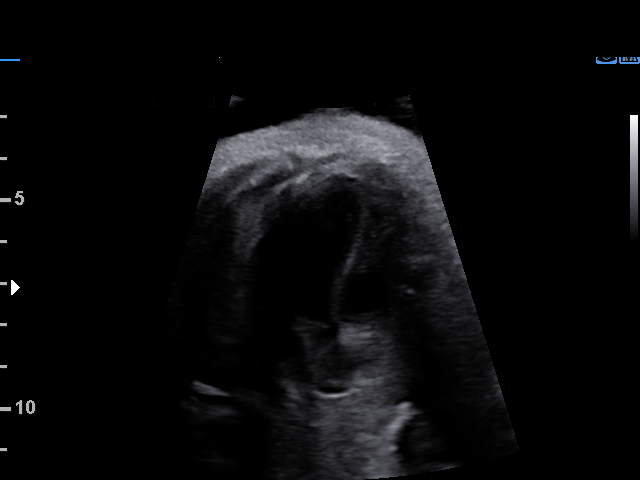

[13 of 28 positions shown; findings below may reference images not displayed]

MMANDU NP

Indications

 35 weeks gestation of pregnancy
 Obesity complicating pregnancy, third
 trimester BMI 36
 Insufficient Prenatal Care- transfer from
 [REDACTED]
 Declined NIPS
 Poor obstetrical history- hx GHTN
Fetal Evaluation

 Num Of Fetuses:         1
 Fetal Heart Rate(bpm):  157
 Cardiac Activity:       Observed
 Presentation:           Cephalic
 Placenta:               Posterior
 P. Cord Insertion:      Not well visualized

 Amniotic Fluid
 AFI FV:      Within normal limits

 AFI Sum(cm)     %Tile       Largest Pocket(cm)
 18.21           68

 RUQ(cm)       RLQ(cm)       LUQ(cm)        LLQ(cm)

Biometry

 BPD:      90.4  mm     G. Age:  36w 4d         83  %    CI:        78.86   %    70 - 86
                                                         FL/HC:      21.1   %    20.1 -
 HC:      321.9  mm     G. Age:  36w 2d         36  %    HC/AC:      0.94        0.93 -
 AC:      343.2  mm     G. Age:  38w 2d         99  %    FL/BPD:     75.2   %    71 - 87
 FL:         68  mm     G. Age:  35w 0d         28  %    FL/AC:      19.8   %    20 - 24
 CER:      48.4  mm     G. Age:  36w 2d         52  %
 LV:        1.7  mm
 CM:        6.6  mm

 Est. FW:    5283  gm    6 lb 14 oz      87  %
OB History

 Gravidity:    2         Term:   1
 Living:       1
Gestational Age

 LMP:           35w 4d        Date:  11/30/20                 EDD:   09/06/21
 U/S Today:     36w 4d                                        EDD:   08/30/21
 Best:          35w 4d     Det. By:  LMP  (11/30/20)          EDD:   09/06/21
Anatomy

 Cranium:               Appears normal         Aortic Arch:            Appears normal
 Cavum:                 Appears normal         Ductal Arch:            Not well visualized
 Ventricles:            Appears normal         Diaphragm:              Appears normal
 Choroid Plexus:        Appears normal         Stomach:                Appears normal, left
                                                                       sided
 Cerebellum:            Appears normal         Abdomen:                Appears normal
 Posterior Fossa:       Appears normal         Abdominal Wall:         Appears nml (cord
                                                                       insert, abd wall)
 Nuchal Fold:           Not applicable (>20    Cord Vessels:           Appears normal (3
                        wks GA)                                        vessel cord)
 Face:                  Appears normal         Kidneys:                Appear normal
                        (orbits and profile)
 Lips:                  Appears normal         Bladder:                Appears normal
 Thoracic:              Appears normal         Spine:                  Ltd views no
                                                                       intracranial signs of
                                                                       NTD
 Heart:                 Not well visualized    Upper Extremities:      Visualized
 RVOT:                  Not well visualized    Lower Extremities:      Visualized
 LVOT:                  Appears normal
Targeted Anatomy

 Thorax
 SVC:                   Not well visualized    3 V Trachea View:       Appears normal
 3 Vessel View:         Appears normal         IVC:                    Not well visualized

 Other
 Genitalia:             Normal Female
Cervix Uterus Adnexa

 Cervix
 Not visualized (advanced GA >76wks)

 Right Ovary
 Visualized.
 Left Ovary
 Visualized.
Impression

 G2 P1.  Patient recently moved [place] the [place] from
 [place] where she initiated her prenatal care.  She reports
 she had screening and does not have gestational diabetes.
 She reports no chronic medical conditions.
 Obstetric history significant for a term vaginal delivery.

 Fetal growth is appropriate for gestational age.  Amniotic fluid
 is normal and good fetal activity seen.  Fetal anatomical
 survey appears normal but limited by advanced gestational
 age.
 We reassured the patient of the findings.
Recommendations

 Follow-up scans as clinically indicated.
                 Bencosme, Lokesh

## 2022-05-06 ENCOUNTER — Ambulatory Visit: Payer: Medicaid Other | Admitting: Advanced Practice Midwife
